# Patient Record
Sex: Female | Born: 1990 | Race: White | Hispanic: No | Marital: Single
Health system: Southern US, Community
[De-identification: ages and names within clinical notes are randomized; demographics above are authoritative.]

---

## 2020-10-30 ENCOUNTER — Other Ambulatory Visit: Payer: Self-pay

## 2020-10-30 DIAGNOSIS — Z20822 Contact with and (suspected) exposure to covid-19: Secondary | ICD-10-CM

## 2020-11-01 LAB — SARS-COV-2, NAA 2 DAY TAT

## 2020-11-01 LAB — NOVEL CORONAVIRUS, NAA: SARS-CoV-2, NAA: NOT DETECTED

## 2021-02-17 ENCOUNTER — Emergency Department (HOSPITAL_BASED_OUTPATIENT_CLINIC_OR_DEPARTMENT_OTHER)
Admission: EM | Admit: 2021-02-17 | Discharge: 2021-02-17 | Disposition: A | Discharge status: Home / Self Care | Payer: Commercial Managed Care - PPO | Attending: Emergency Medicine | Admitting: Emergency Medicine

## 2021-02-17 ENCOUNTER — Emergency Department (HOSPITAL_BASED_OUTPATIENT_CLINIC_OR_DEPARTMENT_OTHER): Payer: Commercial Managed Care - PPO

## 2021-02-17 ENCOUNTER — Other Ambulatory Visit: Payer: Self-pay

## 2021-02-17 ENCOUNTER — Encounter (HOSPITAL_BASED_OUTPATIENT_CLINIC_OR_DEPARTMENT_OTHER): Payer: Self-pay

## 2021-02-17 DIAGNOSIS — W010XXA Fall on same level from slipping, tripping and stumbling without subsequent striking against object, initial encounter: Secondary | ICD-10-CM | POA: Insufficient documentation

## 2021-02-17 DIAGNOSIS — W19XXXA Unspecified fall, initial encounter: Secondary | ICD-10-CM

## 2021-02-17 DIAGNOSIS — Z23 Encounter for immunization: Secondary | ICD-10-CM | POA: Insufficient documentation

## 2021-02-17 DIAGNOSIS — S80211A Abrasion, right knee, initial encounter: Secondary | ICD-10-CM | POA: Diagnosis not present

## 2021-02-17 DIAGNOSIS — S8991XA Unspecified injury of right lower leg, initial encounter: Secondary | ICD-10-CM

## 2021-02-17 DIAGNOSIS — Y9302 Activity, running: Secondary | ICD-10-CM | POA: Insufficient documentation

## 2021-02-17 MED ORDER — TETANUS-DIPHTH-ACELL PERTUSSIS 5-2.5-18.5 LF-MCG/0.5 IM SUSY
0.5000 mL | PREFILLED_SYRINGE | Freq: Once | INTRAMUSCULAR | Status: AC
  Administered 2021-02-17: 0.5 mL via INTRAMUSCULAR
  Filled 2021-02-17: qty 0.5

## 2021-02-17 NOTE — ED Provider Notes (Signed)
[facility] EMERGENCY DEPT Provider Note   CSN: [id number] Arrival date & time: 02/17/21  1900     History Chief Complaint  Patient presents with  . Fall  . Knee Pain    Katrina Cook is a 30 y.o. female.  The history is provided by the patient and medical records. No language interpreter was used.  Knee Pain Location:  Knee Injury: yes   Mechanism of injury: fall   Fall:    Fall occurred:  [employer] of impact:  Knees   Entrapped after fall: no   Knee location:  R knee Pain details:    Quality:  Aching   Radiates to:  Does not radiate   Severity:  Moderate   Onset quality:  Sudden Chronicity:  New Tetanus status:  Unknown Associated symptoms: no back pain, no fatigue, no fever and no neck pain        History reviewed. No pertinent past medical history.  There are no problems to display for this patient.   History reviewed. No pertinent surgical history.   OB History   No obstetric history on file.     No family history on file.     Home Medications Prior to Admission medications   Not on File    Allergies    Patient has no allergy information on record.  Review of Systems   Review of Systems  Constitutional: Negative for chills, fatigue and fever.  HENT: Negative for congestion.   Respiratory: Negative for cough, chest tightness and shortness of breath.   Cardiovascular: Negative for chest pain.  Gastrointestinal: Negative for abdominal pain and nausea.  Genitourinary: Negative for dysuria.  Musculoskeletal: Negative for back pain, neck pain and neck stiffness.  Skin: Positive for wound.  Neurological: Negative for dizziness, light-headedness and headaches.  Psychiatric/Behavioral: Negative for agitation and confusion.  All other systems reviewed and are negative.   Physical Exam Updated Vital Signs BP (!) 126/109 (BP Location: Right Arm)   Pulse 89   Temp 98 F (36.7 C) (Oral)   Resp 18   Ht 5\' 5"  (1.651 m)    Wt 78.6 kg   LMP 02/07/2021   SpO2 100%   BMI 28.84 kg/m   Physical Exam Vitals and nursing note reviewed.  Constitutional:      General: She is not in acute distress.    Appearance: She is well-developed. She is not toxic-appearing or diaphoretic.  HENT:     Head: Normocephalic and atraumatic.  Eyes:     Conjunctiva/sclera: Conjunctivae normal.  Cardiovascular:     Rate and Rhythm: Normal rate and regular rhythm.     Heart sounds: No murmur heard.   Pulmonary:     Effort: Pulmonary effort is normal. No respiratory distress.     Breath sounds: Normal breath sounds.  Abdominal:     Palpations: Abdomen is soft.     Tenderness: There is no abdominal tenderness. There is no right CVA tenderness, left CVA tenderness, guarding or rebound.  Musculoskeletal:        General: Tenderness and signs of injury present.     Cervical back: Neck supple.     Right knee: No lacerations (abrasions). Tenderness present.       Legs:     Comments: Normal sensation, pulse, and strength distally to the injury.  Skin:    General: Skin is warm and dry.     Capillary Refill: Capillary refill takes less than 2 seconds.  Neurological:  General: No focal deficit present.     Mental Status: She is alert.  Psychiatric:        Mood and Affect: Mood normal.     ED Results / Procedures / Treatments   Labs (all labs ordered are listed, but only abnormal results are displayed) Labs Reviewed - No data to display  EKG None  Radiology DG Knee 2 Views Right  Result Date: 02/17/2021 CLINICAL DATA:  Right knee pain after fall EXAM: RIGHT KNEE - 1-2 VIEW COMPARISON:  None. FINDINGS: No evidence of fracture, dislocation, or joint effusion. No evidence of arthropathy or other focal bone abnormality. Soft tissues are unremarkable. IMPRESSION: Negative. Electronically Signed   By: Duanne Guess D.O.   On: 02/17/2021 19:39    Procedures Procedures   Medications Ordered in ED Medications  Tdap  (BOOSTRIX) injection 0.5 mL (0.5 mLs Intramuscular Given 02/17/21 2000)    ED Course  I have reviewed the triage vital signs and the nursing notes.  Pertinent labs & imaging results that were available during my care of the patient were reviewed by me and considered in my medical decision making (see chart for details).    MDM Rules/Calculators/A&P                          Katrina Cook is a 30 y.o. female with a past medical history significant for left ACL repair and previous right meniscus injury who presents with fall and right knee injury.  Patient reports that she was running outside on gravel when her left leg slipped causing her right knee to scrape and hit on the ground.  She reports is been very painful to walk and has some skin tears/abrasions on the anterior knee.  She denies any numbness, ting, weakness distally.  Denies any hip pain or other injuries.  She reports the pain is moderate to severe when palpated but is not as painful when she bends it.  She denies any other preceding symptoms.  She did not hit her head or knocked herself out.  On exam, patient does have tenderness in the patellar area in the anterior side of her knee.  She has some skin tears and abrasions on the front of her knee but no deep laceration seen.  Intact pulses, sensation, strength distally.  No hip tenderness.  No posterior knee tenderness.  Exam otherwise unremarkable.  Patient will have x-rays to look for fracture dislocation but clinically suspect she has soft tissue injury or even connective injury such as tendon or ligament injury.  No laceration seen but more of skin tears and abrasions.  Patient will have her wounds washed and dressed and have bacitracin and a skin tear dressing applied.  Patient had x-ray showing no evidence of fracture, dislocation, or foreign bodies.  Patiently will be given knee immobilizer and given crutches and will follow up with orthopedics if her symptoms persist and she  may need outpatient more best imaging.  Patient will be discharged.  Patient reports he has been on crutches and immobilizer emesis is at home.  She will call her orthopedist.  Patient discharged in good condition.     Final Clinical Impression(s) / ED Diagnoses Final diagnoses:  Fall injury while running  Injury of right knee, initial encounter  Abrasion of right knee, initial encounter    Rx / DC Orders ED Discharge Orders    None      Clinical Impression: 1. Fall injury while  running   2. Injury of right knee, initial encounter   3. Abrasion of right knee, initial encounter     Disposition: Discharge  Condition: Good  I have discussed the results, Dx and Tx plan with the pt(& family if present). He/she/they expressed understanding and agree(s) with the plan. Discharge instructions discussed at great length. Strict return precautions discussed and pt &/or family have verbalized understanding of the instructions. No further questions at time of discharge.    New Prescriptions   No medications on file    Follow Up: Tarry Kos, MD [address] [place] [address] [phone]   with ortho  [facility] [address] [place] [address] [phone]         Mario Voong, Canary Brim, MD 02/17/21 2001

## 2021-02-17 NOTE — ED Notes (Signed)
Pt declined crutches and knee immobilizer.   

## 2021-02-17 NOTE — ED Triage Notes (Signed)
Pt was on a run this evening when she tripped and fell. Pt is c/o right knee pain with abrasions noted.

## 2021-02-17 NOTE — ED Notes (Signed)
Patient states she has knee brace and crutches at home and would prefer to use those instead of being given new ones.

## 2021-02-17 NOTE — Discharge Instructions (Signed)
Your history and exams are consistent with a knee injury but the x-rays did not show evidence of fracture dislocation.  I do think there may be soft tissue or ligamentous injury given your mechanism of injury however on exam we did not feel that sutures were needed for the skin injury.  Please watch for signs and symptoms of infection and we updated her tetanus.  Please use the knee immobilizer and crutches and follow-up with orthopedics.  If any symptoms change or worsen acutely, was done to the nearest emergency department.

## 2022-04-19 IMAGING — DX DG KNEE 1-2V*R*
1 series · 2 of 2 positions shown · non-contrast
Comparison: None.

CLINICAL DATA: Right knee pain after fall

EXAM:
RIGHT KNEE - 1-2 VIEW

[Series 1: knee · 0.14mm/px · 2 of 2 slices shown]
[im 1/2]
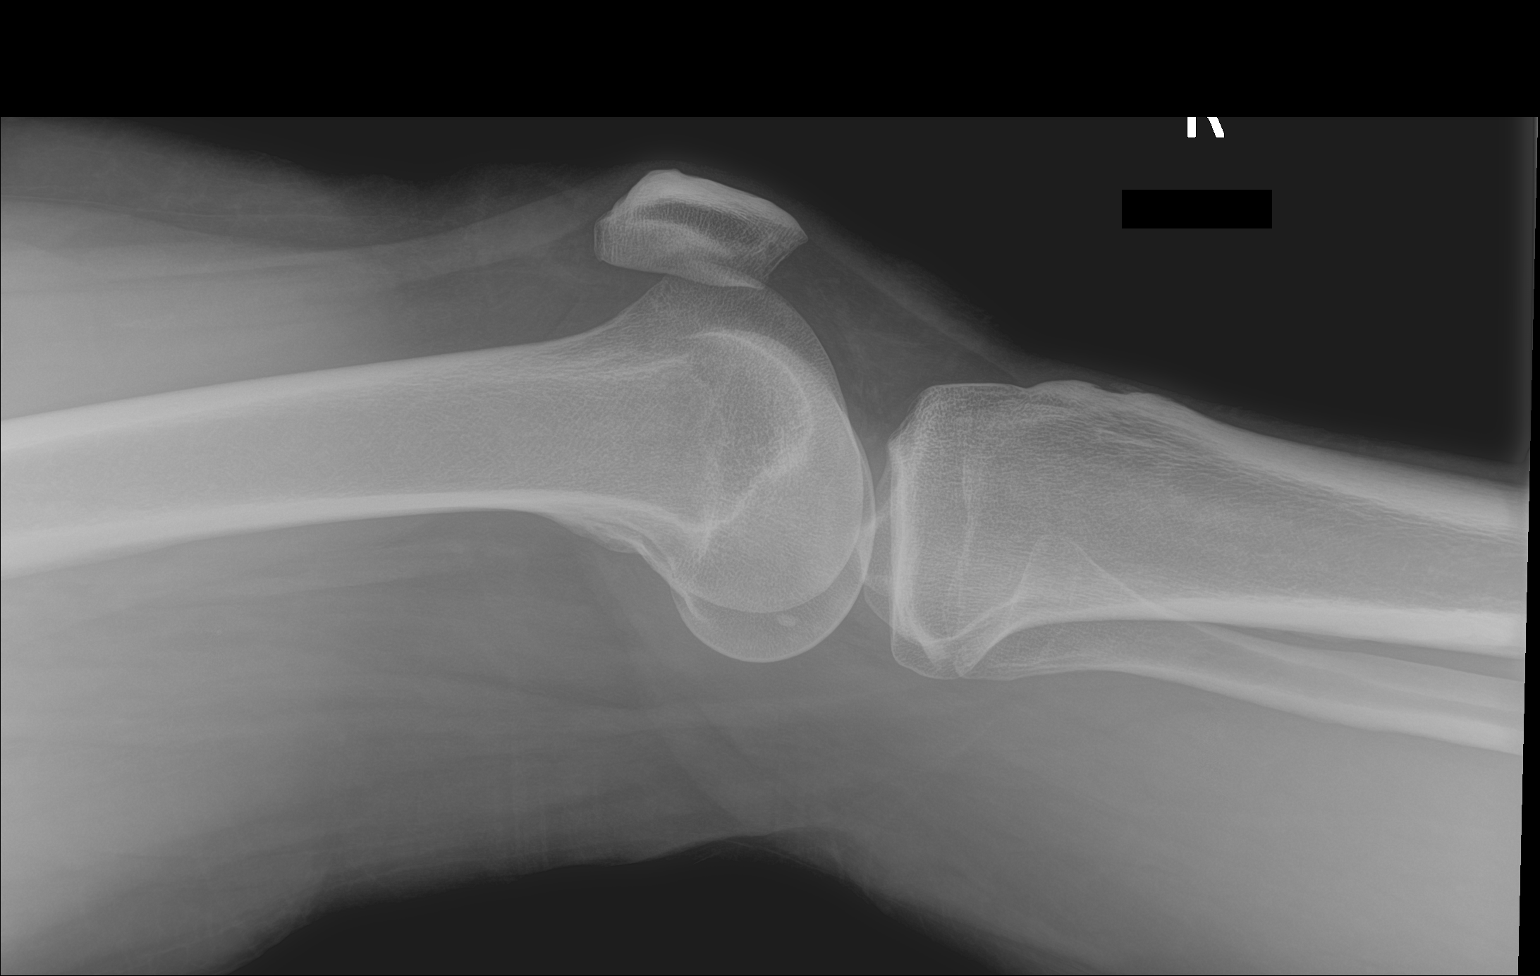
[im 2/2]
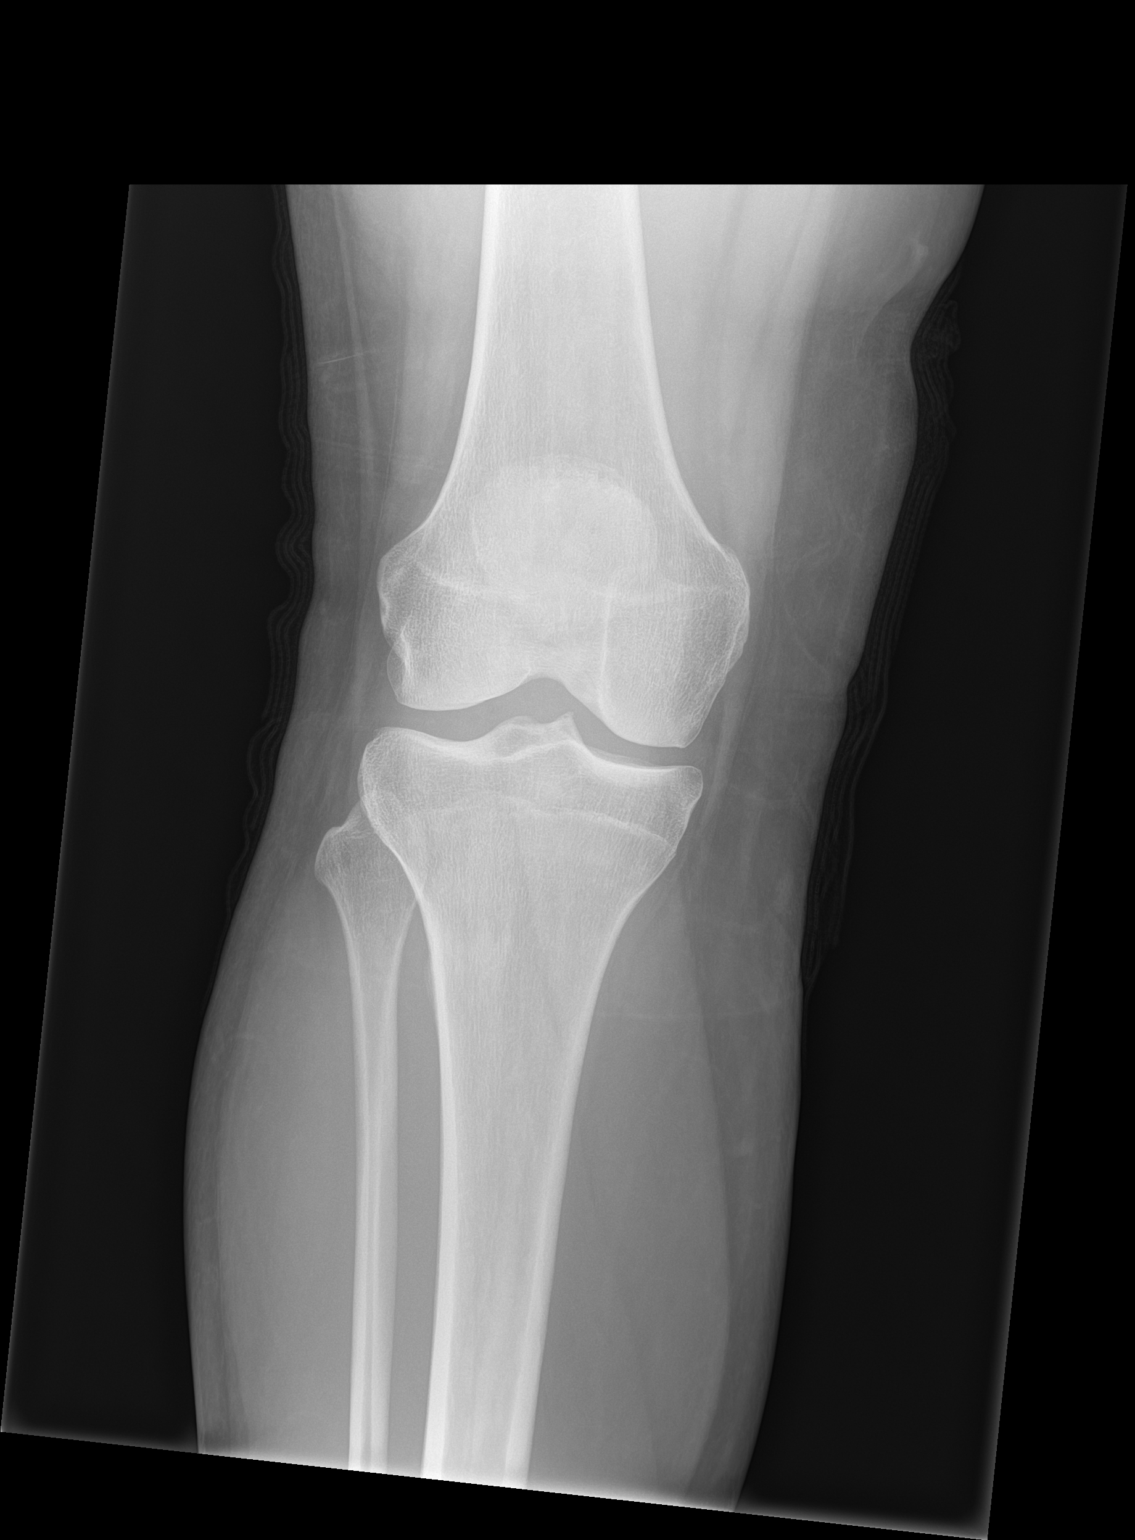

[2 of 2 positions shown; findings below may reference images not displayed]

FINDINGS: No evidence of fracture, dislocation, or joint effusion. No evidence
of arthropathy or other focal bone abnormality. Soft tissues are
unremarkable.
IMPRESSION: Negative.
